# Patient Record
Sex: Female | Born: 1970 | Race: Black or African American | Hispanic: No | Marital: Single | Smoking: Never smoker
Health system: Southern US, Community
[De-identification: ages and names within clinical notes are randomized; demographics above are authoritative.]

## PROBLEM LIST (undated history)

## (undated) DIAGNOSIS — E785 Hyperlipidemia, unspecified: Secondary | ICD-10-CM

## (undated) DIAGNOSIS — H8109 Meniere's disease, unspecified ear: Secondary | ICD-10-CM

## (undated) DIAGNOSIS — B019 Varicella without complication: Secondary | ICD-10-CM

## (undated) DIAGNOSIS — K921 Melena: Secondary | ICD-10-CM

## (undated) DIAGNOSIS — A6009 Herpesviral infection of other urogenital tract: Secondary | ICD-10-CM

## (undated) HISTORY — DX: Meniere's disease, unspecified ear: H81.09

## (undated) HISTORY — DX: Varicella without complication: B01.9

## (undated) HISTORY — PX: EYE SURGERY: SHX253

## (undated) HISTORY — DX: Herpesviral infection of other urogenital tract: A60.09

## (undated) HISTORY — DX: Melena: K92.1

## (undated) HISTORY — PX: TUBAL LIGATION: SHX77

## (undated) HISTORY — DX: Hyperlipidemia, unspecified: E78.5

---

## 1997-11-26 ENCOUNTER — Inpatient Hospital Stay (HOSPITAL_COMMUNITY): Admission: AD | Admit: 1997-11-26 | Discharge: 1997-11-29 | Payer: Self-pay | Admitting: Obstetrics and Gynecology

## 2000-03-19 ENCOUNTER — Other Ambulatory Visit: Admission: RE | Admit: 2000-03-19 | Discharge: 2000-03-19 | Payer: Self-pay | Admitting: Obstetrics and Gynecology

## 2000-09-04 ENCOUNTER — Ambulatory Visit (HOSPITAL_COMMUNITY): Admission: RE | Admit: 2000-09-04 | Discharge: 2000-09-04 | Payer: Self-pay | Admitting: Internal Medicine

## 2000-09-04 ENCOUNTER — Encounter: Payer: Self-pay | Admitting: Internal Medicine

## 2001-03-22 ENCOUNTER — Other Ambulatory Visit: Admission: RE | Admit: 2001-03-22 | Discharge: 2001-03-22 | Payer: Self-pay | Admitting: Obstetrics and Gynecology

## 2003-08-22 ENCOUNTER — Other Ambulatory Visit: Admission: RE | Admit: 2003-08-22 | Discharge: 2003-08-22 | Payer: Self-pay | Admitting: Obstetrics and Gynecology

## 2007-03-10 ENCOUNTER — Ambulatory Visit (HOSPITAL_COMMUNITY): Admission: RE | Admit: 2007-03-10 | Discharge: 2007-03-10 | Payer: Self-pay | Admitting: Obstetrics and Gynecology

## 2007-11-06 IMAGING — US US-BREAST([ID])
1 series · 14 of 16 positions shown · non-contrast
Comparison: NONE

CLINICAL DATA: Follow-up mammogram.   Known cysts in the right 
breast.   New nodule anterior left breast on recent mammogram from 
05-14-06.  

LEFT BREAST ULTRASOUND

[Series 1: us breast · 0.07mm/px · 14 of 16 slices shown]
[im 1/16]
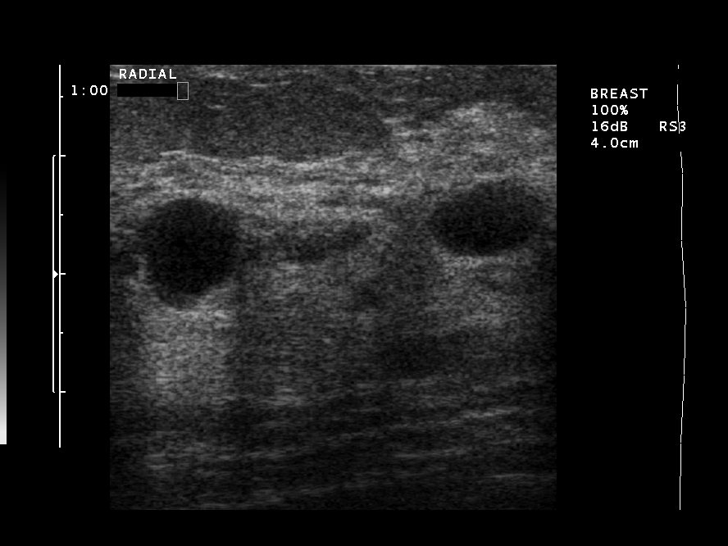
[im 2/16]
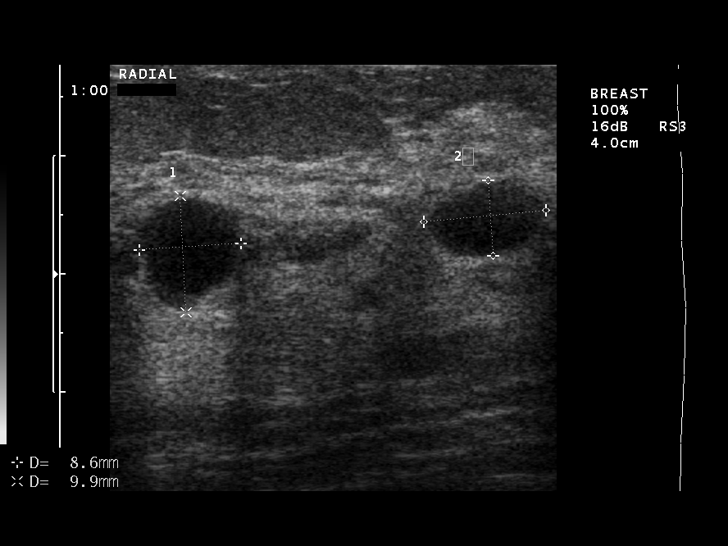
[im 3/16]
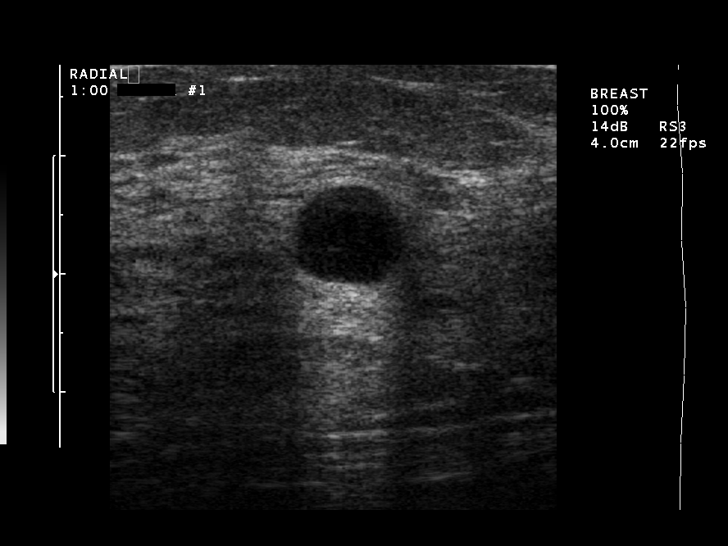
[im 5/16]
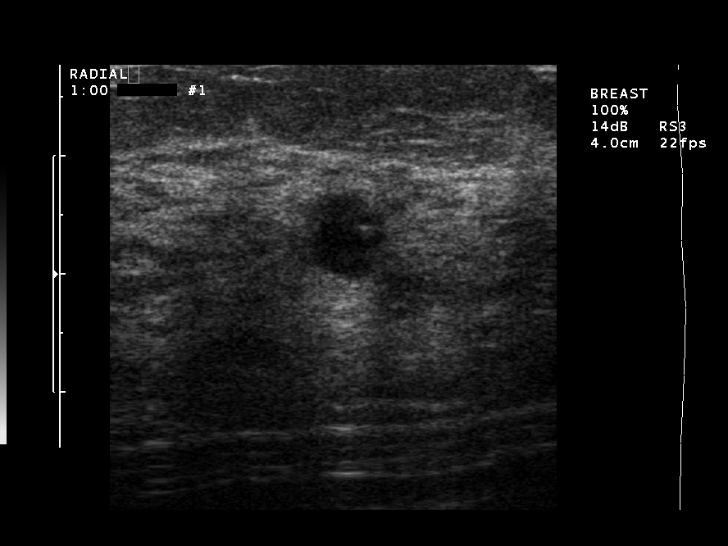
[im 6/16]
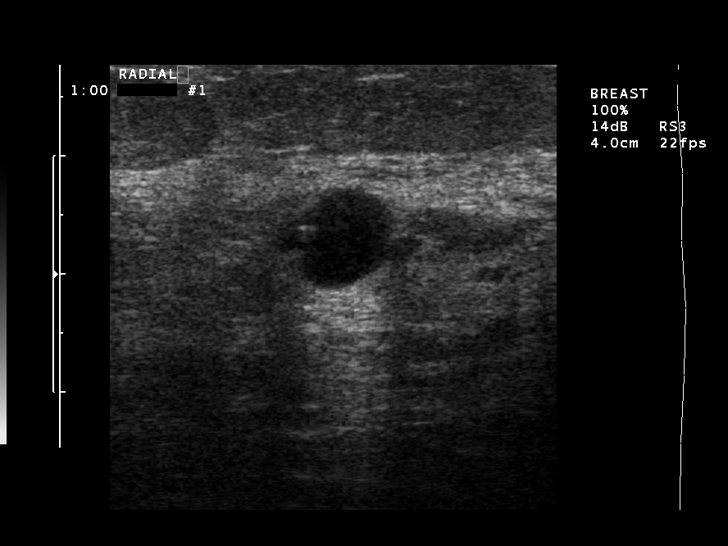
[im 7/16]
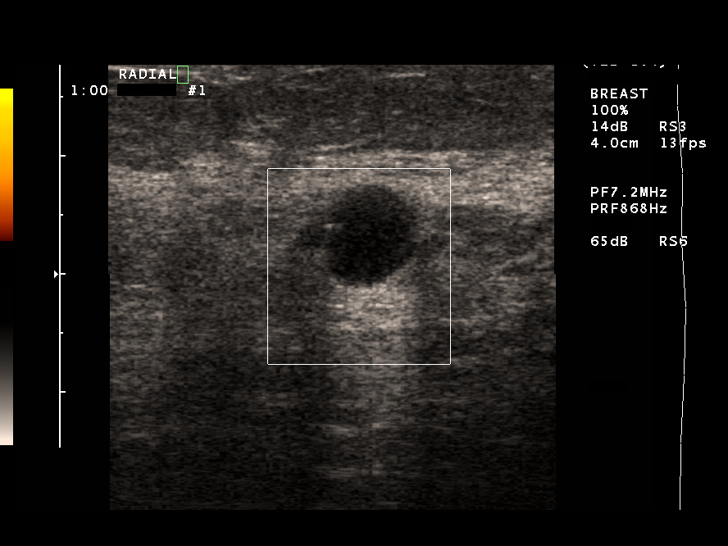
[im 8/16]
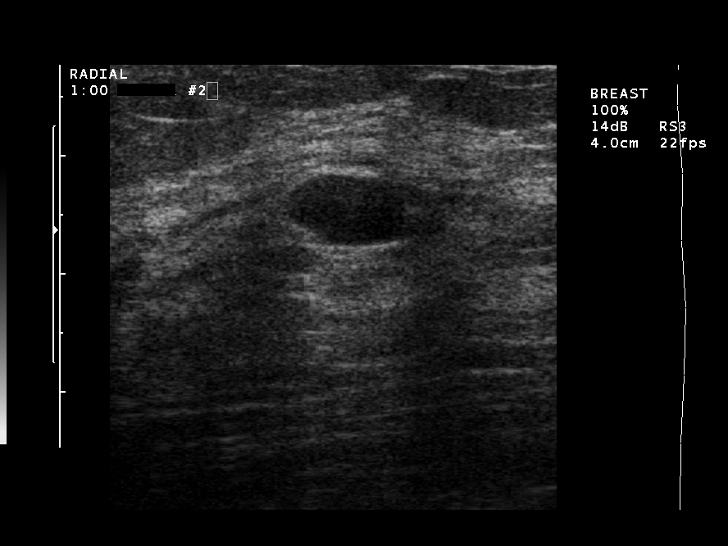
[im 9/16]
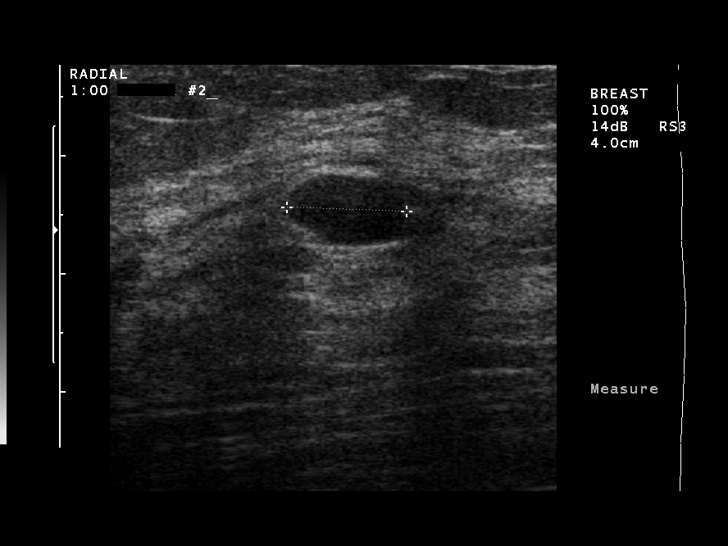
[im 10/16]
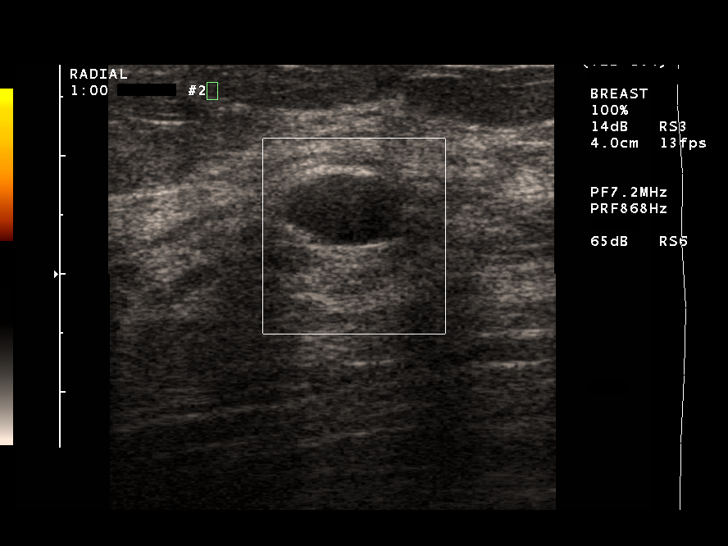
[im 11/16]
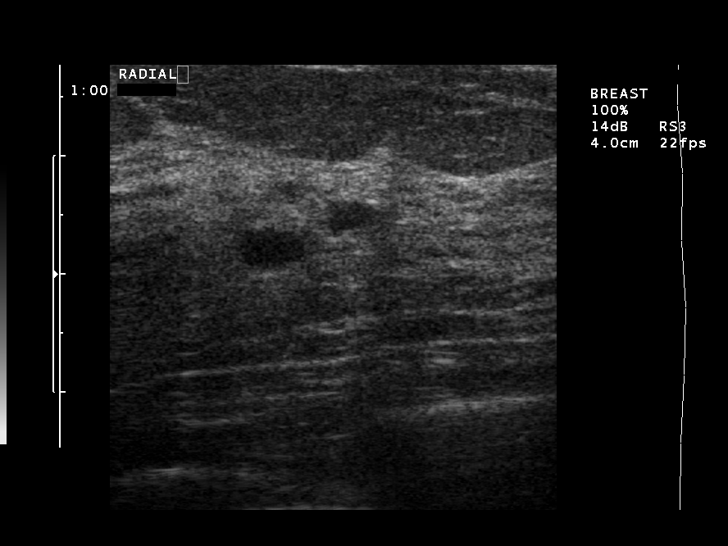
[im 13/16]
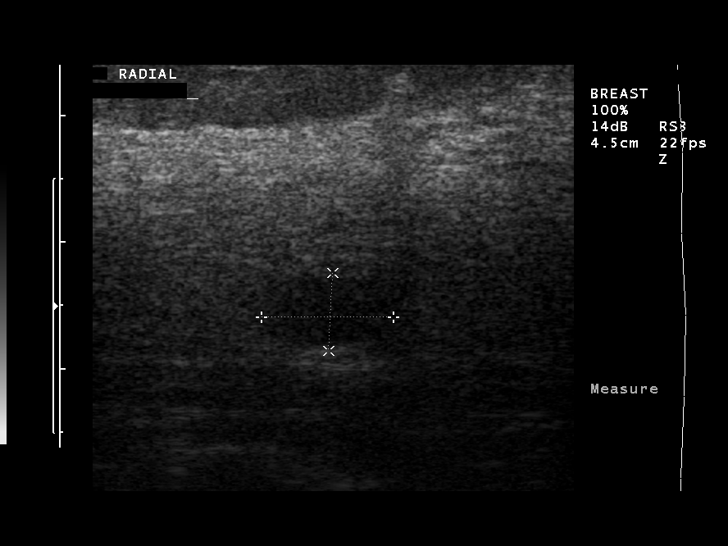
[im 14/16]
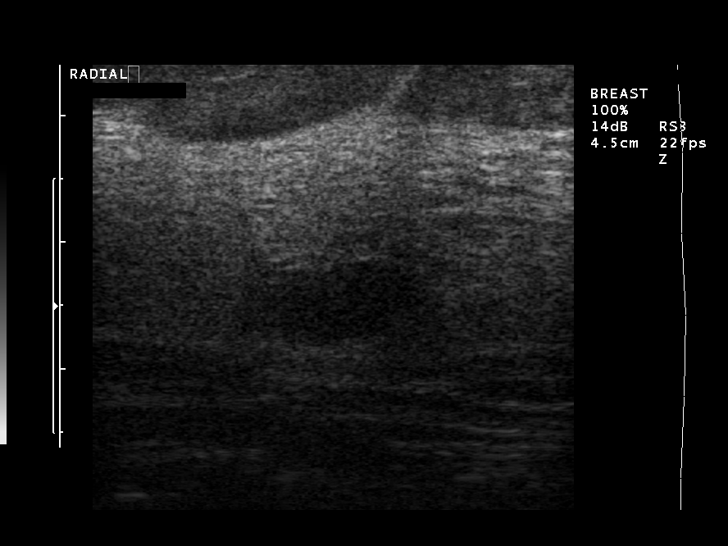
[im 15/16]
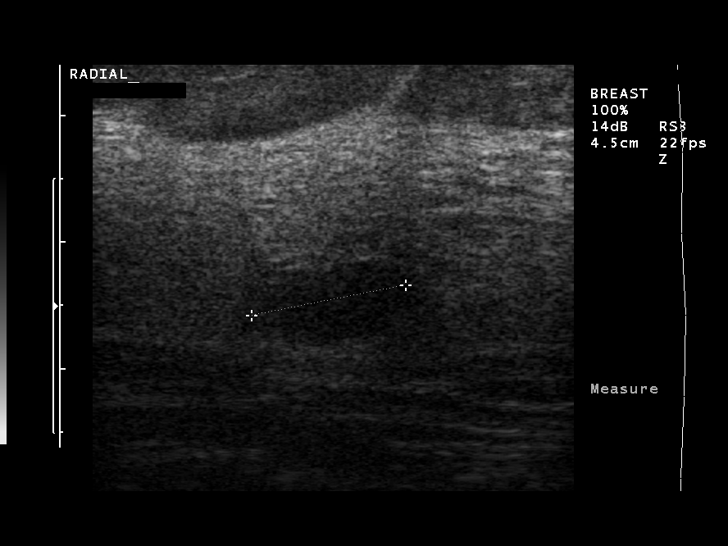
[im 16/16]
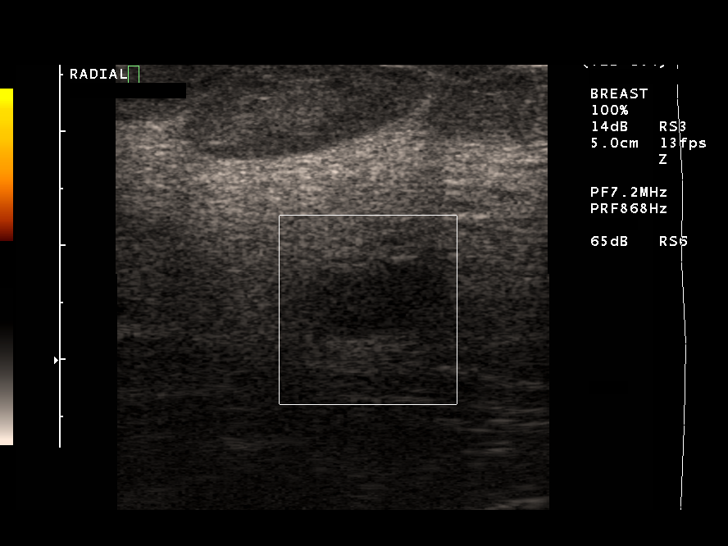

[14 of 16 positions shown; findings below may reference images not displayed]

FINDINGS: Ultrasound shows two simple benign cysts at [DATE] 
measuring 8.6 x 9.9 mm and 10.4 x 6.4 mm respectively with a third 
cyst more deep in position at [DATE] measuring 10.4 x 6.1 mm.
IMPRESSION: Three benign cysts left breast.  Annual screening 
mammography recommended. BI-RADS 2- Benign Jn Bernard Delpe, M.D. 
Date: 06/03/2006 DL  GIORGI

## 2009-11-29 ENCOUNTER — Telehealth (INDEPENDENT_AMBULATORY_CARE_PROVIDER_SITE_OTHER): Payer: Self-pay | Admitting: *Deleted

## 2010-05-22 NOTE — Progress Notes (Signed)
Summary: triage  Phone Note Call from Patient Call back at [phone]   Caller: Schioban, scheduler Reason for Call: Talk to Nurse Summary of Call: pt is sch'ed for next available (9/12), but Dr. Kathrynn Running would like pt seen sooner Initial call taken by: Vallarie Mare,  November 29, 2009 3:33 PM  Follow-up for Phone Call        Pt. will see Willette Cluster NP on 12-02-09 at 3pm. Schioban will advise pt. of appt/med.list/co-pay and she will fax records to [facility].  Follow-up by: Laureen Ochs LPN,  November 29, 2009 3:55 PM

## 2010-09-02 NOTE — Op Note (Signed)
NAME:  Kristi Mayo, Kristi Mayo              ACCOUNT NO.:  [id number]   MEDICAL RECORD NO.:  [id number]          PATIENT TYPE:  AMB   LOCATION:  [facility]                           FACILITY:  [facility]   PHYSICIAN:  Lenoard Aden, M.D.DATE OF BIRTH:  February 10, 1971   DATE OF PROCEDURE:  03/10/2007  DATE OF DISCHARGE:                               OPERATIVE REPORT   PREOPERATIVE DIAGNOSIS:  Desire for elective sterilization.   POSTOPERATIVE DIAGNOSIS:  Desire for elective sterilization.   PROCEDURE:  Laparoscopic tubal ligation.   SURGEON:  Lenoard Aden, M.D.   ANESTHESIA:  General.   ESTIMATED BLOOD LOSS:  Less than 50 mL.   COMPLICATIONS:  None.   DRAINS:  None.   COUNTS:  Correct.   Patient to recovery in good condition.   BRIEF OP NOTE:  Being apprised of risks of anesthesia, infection,  bleeding, injury to abdominal organs,  need for repair, delayed versus  complications to include bowel and bladder injury, failure risk of tubal  ligation 5 to 10 per thousand, the patient brought to the operating she  was administered general anesthetic without complications, prepped and  draped in usual sterile fashion, catheterized until bladder was empty.  Exam under anesthesia was a small anteflexed uterus.  No adnexal masses.  Hulka tenaculum placed per vagina.  Infraumbilical incision made with a  scalpel.  Veress needle placed, opening pressure -1 noted, 3 liters CO2  insufflated without difficulty.  Trocar placement atraumatically.  Visualization reveals atraumatic trocar placement, normal liver  gallbladder area, normal appendiceal area.  At this time the uterus  appears to be normal size, shape and contour.  No adnexal masses were  noted.  Bilateral normal tubes and ovaries.  No posterior or anterior  cul-de-sac lesions or growths are noted.  At this time the right tube  was traced out to the fimbriated end, ampullary isthmic portion of the  tube is grasped and coagulated using bipolar  cautery in three contiguous  areas of ampullary isthmic section of the tube.  Tube is divided, tubal  lumens are noted.  Same procedure that is done on the right tube is done  on the left tube.  Bilateral normal ovaries were noted.  Good hemostasis  noted.  CO2 was released.  Bowels inspected.  Ureters were peristalsing  normal bilaterally.  At this time procedure is terminated.  CO2 was  released.  All instruments removed under direct visualization.  Incision  closed using Vicryl and Dermabond.  The patient tolerated the procedure  well, went to recovery in good condition.      Lenoard Aden, M.D.  Electronically Signed     RJT/MEDQ  D:  03/10/2007  T:  03/11/2007  Job:  [id number]

## 2011-01-27 LAB — CBC
HCT: 37.5
Hemoglobin: 13
MCHC: 34.7
MCV: 92.3
Platelets: 352
RBC: 4.06
RDW: 12.7
WBC: 7.8

## 2011-01-27 LAB — HCG, SERUM, QUALITATIVE: Preg, Serum: NEGATIVE

## 2012-03-04 ENCOUNTER — Ambulatory Visit: Payer: Managed Care, Other (non HMO)

## 2013-07-14 ENCOUNTER — Encounter: Payer: Self-pay | Admitting: Physician Assistant

## 2013-07-14 ENCOUNTER — Other Ambulatory Visit (INDEPENDENT_AMBULATORY_CARE_PROVIDER_SITE_OTHER): Payer: Managed Care, Other (non HMO)

## 2013-07-14 ENCOUNTER — Ambulatory Visit (INDEPENDENT_AMBULATORY_CARE_PROVIDER_SITE_OTHER): Payer: Managed Care, Other (non HMO) | Admitting: Physician Assistant

## 2013-07-14 VITALS — BP 130/80 | Temp 98.3°F | Ht 65.0 in | Wt 177.1 lb

## 2013-07-14 DIAGNOSIS — A6000 Herpesviral infection of urogenital system, unspecified: Secondary | ICD-10-CM

## 2013-07-14 DIAGNOSIS — Z0000 Encounter for general adult medical examination without abnormal findings: Secondary | ICD-10-CM

## 2013-07-14 DIAGNOSIS — H8109 Meniere's disease, unspecified ear: Secondary | ICD-10-CM

## 2013-07-14 DIAGNOSIS — E785 Hyperlipidemia, unspecified: Secondary | ICD-10-CM

## 2013-07-14 LAB — URINALYSIS, ROUTINE W REFLEX MICROSCOPIC
Bilirubin Urine: NEGATIVE
Hgb urine dipstick: NEGATIVE
Ketones, ur: 15 — AB
Leukocytes, UA: NEGATIVE
Nitrite: NEGATIVE
SPECIFIC GRAVITY, URINE: 1.01 (ref 1.000–1.030)
TOTAL PROTEIN, URINE-UPE24: NEGATIVE
URINE GLUCOSE: NEGATIVE
UROBILINOGEN UA: 1 (ref 0.0–1.0)
pH: 7 (ref 5.0–8.0)

## 2013-07-14 LAB — CBC WITH DIFFERENTIAL/PLATELET
Basophils Absolute: 0 10*3/uL (ref 0.0–0.1)
Basophils Relative: 0.4 % (ref 0.0–3.0)
EOS PCT: 0.7 % (ref 0.0–5.0)
Eosinophils Absolute: 0.1 10*3/uL (ref 0.0–0.7)
HCT: 38.3 % (ref 36.0–46.0)
Hemoglobin: 12.8 g/dL (ref 12.0–15.0)
LYMPHS PCT: 24.9 % (ref 12.0–46.0)
Lymphs Abs: 2.2 10*3/uL (ref 0.7–4.0)
MCHC: 33.5 g/dL (ref 30.0–36.0)
MCV: 90.7 fl (ref 78.0–100.0)
Monocytes Absolute: 0.3 10*3/uL (ref 0.1–1.0)
Monocytes Relative: 3.1 % (ref 3.0–12.0)
NEUTROS PCT: 70.9 % (ref 43.0–77.0)
Neutro Abs: 6.2 10*3/uL (ref 1.4–7.7)
PLATELETS: 323 10*3/uL (ref 150.0–400.0)
RBC: 4.23 Mil/uL (ref 3.87–5.11)
RDW: 12.7 % (ref 11.5–14.6)
WBC: 8.7 10*3/uL (ref 4.5–10.5)

## 2013-07-14 LAB — LIPID PANEL
CHOLESTEROL: 240 mg/dL — AB (ref 0–200)
HDL: 38.6 mg/dL — ABNORMAL LOW (ref >39.00)
LDL Cholesterol: 182 mg/dL — ABNORMAL HIGH (ref 0–99)
Total CHOL/HDL Ratio: 6
Triglycerides: 96 mg/dL (ref 0.0–149.0)
VLDL: 19.2 mg/dL (ref 0.0–40.0)

## 2013-07-14 LAB — BASIC METABOLIC PANEL
BUN: 8 mg/dL (ref 6–23)
CALCIUM: 9.2 mg/dL (ref 8.4–10.5)
CHLORIDE: 105 meq/L (ref 96–112)
CO2: 24 mEq/L (ref 19–32)
CREATININE: 0.6 mg/dL (ref 0.4–1.2)
GFR: 143.57 mL/min (ref >60.00)
Glucose, Bld: 84 mg/dL (ref 70–99)
Potassium: 3.7 mEq/L (ref 3.5–5.1)
Sodium: 137 mEq/L (ref 135–145)

## 2013-07-14 LAB — TSH: TSH: 1.05 u[IU]/mL (ref 0.35–5.50)

## 2013-07-14 LAB — HEPATIC FUNCTION PANEL
ALBUMIN: 4 g/dL (ref 3.5–5.2)
ALT: 12 U/L (ref 0–35)
AST: 13 U/L (ref 0–37)
Alkaline Phosphatase: 87 U/L (ref 39–117)
BILIRUBIN DIRECT: 0.1 mg/dL (ref 0.0–0.3)
Total Bilirubin: 0.7 mg/dL (ref 0.3–1.2)
Total Protein: 7.6 g/dL (ref 6.0–8.3)

## 2013-07-14 NOTE — Progress Notes (Signed)
Pre visit review using our clinic review tool, if applicable. No additional management support is needed unless otherwise documented below in the visit note. 

## 2013-07-14 NOTE — Progress Notes (Signed)
Patient ID: Kristi HaversKenyetta N Pinney is a 43 y.o. female DOB: 2070/10/05 MRN: [id number]     HPI:  Patient is a 43 year old female who presents to the office to establish care. Reports history of Meniere's disease treated with Chlorthalid and Pot chloride. Follow with ENT for continued yearly treatment and evaluation. Also history of Genital herpes treated with Valtrax.250 mg as needed.  Denies chest pain/palpitations, SOB, cough, N/V/F/C, change in bowel/bladder habits, blood in urine or stool, light headed, dizzy or weakness.   Influenza: no Tetanus: less than 10 years ago PAP: 10/14 LMP: 06/23/13 Mammogram: 06/2013 Eye Dr. Edd ArbourYearly, ophlamology Dentist regular visit Colonoscopy: 8 years ago for hemorrhoid evaluation   ROS: As stated in HPI. All other systems negative  Past Medical History  Diagnosis Date  . Meniere disease   . Blood in stool   . Chicken pox   . Hyperlipidemia   . Genital herpes in women    Family History  Problem Relation Age of Onset  . Hyperlipidemia Mother   . Hypertension Mother   . Prostate cancer Father   . Hyperlipidemia Father   . Hypertension Father   . Hyperlipidemia Brother   . Hypertension Brother   . Hypertension Maternal Grandmother   . Breast cancer Maternal Grandmother    History   Social History  . Marital Status: Single    Spouse Name: N/A    Number of Children: N/A  . Years of Education: N/A   Social History Main Topics  . Smoking status: Never Smoker   . Smokeless tobacco: None  . Alcohol Use: No  . Drug Use: No  . Sexual Activity: Yes    Partners: Female, Female   Other Topics Concern  . None   Social History Narrative  . None   Past Surgical History  Procedure Laterality Date  . Cesarean section    . Eye surgery    . Tubal ligation     No current outpatient prescriptions on file prior to visit.   No current facility-administered medications on file prior to visit.   Allergies  Allergen Reactions  . Penicillins  Nausea And Vomiting    PE:  Filed Vitals:   07/14/13 1104  BP: 130/80  Temp: 98.3 F (36.8 C)    CONSTITUTIONAL: Well developed, well nourished, pleasant, appears stated age, in NAD HEENT: normocephalic, atraumatic, bilateral ext/int canals normal. Bilateral TM's without injections, bulging, erythema. Nose normal, uvula midline, oropharynx clear and moist. EYES: PERRLA, bilateral EOM and conjunctiva normal NECK: FROM, supple, trachea midline, without thyromegaly or mass CARDIO: RRR, normal S1 and S2, distal pulses intact. PULM/CHEST CTA bilateral, no wheezes, rales or rhonchi. Non tender. ABD: appearance normal, soft, nontender. Normal bowel sounds x 4 quadrants, no HSM GU: deferred.  MUSC: FROM U/LE bilateral, FROM  Of thoracic and Lumbar spine, no midline tenderness LYMPH: no cervical, supraclavicular adenopathy NEURO: alert and oriented x 3, no cranial nerve deficit, motor strength and coordination NL. DTR's intact.  Negative romberg. Gait normal. SKIN: warm, dry, no rash or lesions noted. PSYCH: Mood and affect normal, speech normal.   Lab Results  Component Value Date   WBC 8.7 07/14/2013   HGB 12.8 07/14/2013   HCT 38.3 07/14/2013   PLT 323.0 07/14/2013   GLUCOSE 84 07/14/2013   CHOL 240* 07/14/2013   TRIG 96.0 07/14/2013   HDL 38.60* 07/14/2013   LDLCALC 182* 07/14/2013   ALT 12 07/14/2013   AST 13 07/14/2013   NA 137 07/14/2013  K 3.7 07/14/2013   CL 105 07/14/2013   CREATININE 0.6 07/14/2013   BUN 8 07/14/2013   CO2 24 07/14/2013   TSH 1.05 07/14/2013      ASSESSMENT and PLAN   CPX/v70.0 - Patient has been counseled on age-appropriate routine health concerns for screening and prevention. These are reviewed and up-to-date. Immunizations are up-to-date or declined. Labs ordered and will be reviewed.  Genital Herpes Continue with current medication as prescribed  Meniere's Continue with current medications Keep scheduled follow up appointments with  ENT  Hyperlipidemia Tests show increased cholesterol. No medication treatment recommended but patient should work on low fat, heart healthy diet and participate in regular aerobic exercise program to control same.

## 2013-07-14 NOTE — Patient Instructions (Signed)
It was great to meet you today Kristi Mayo!   Labs have been ordered for you, when you report to lab please be fasting.    Health Maintenance, Female A healthy lifestyle and preventative care can promote health and wellness.  Maintain regular health, dental, and eye exams.  Eat a healthy diet. Foods like vegetables, fruits, whole grains, low-fat dairy products, and lean protein foods contain the nutrients you need without too many calories. Decrease your intake of foods high in solid fats, added sugars, and salt. Get information about a proper diet from your caregiver, if necessary.  Regular physical exercise is one of the most important things you can do for your health. Most adults should get at least 150 minutes of moderate-intensity exercise (any activity that increases your heart rate and causes you to sweat) each week. In addition, most adults need muscle-strengthening exercises on 2 or more days a week.   Maintain a healthy weight. The body mass index (BMI) is a screening tool to identify possible weight problems. It provides an estimate of body fat based on height and weight. Your caregiver can help determine your BMI, and can help you achieve or maintain a healthy weight. For adults 20 years and older:  A BMI below 18.5 is considered underweight.  A BMI of 18.5 to 24.9 is normal.  A BMI of 25 to 29.9 is considered overweight.  A BMI of 30 and above is considered obese.  Maintain normal blood lipids and cholesterol by exercising and minimizing your intake of saturated fat. Eat a balanced diet with plenty of fruits and vegetables. Blood tests for lipids and cholesterol should begin at age 48 and be repeated every 5 years. If your lipid or cholesterol levels are high, you are over 50, or you are a high risk for heart disease, you may need your cholesterol levels checked more frequently.Ongoing high lipid and cholesterol levels should be treated with medicines if diet and exercise are  not effective.  If you smoke, find out from your caregiver how to quit. If you do not use tobacco, do not start.  Lung cancer screening is recommended for adults aged 51 80 years who are at high risk for developing lung cancer because of a history of smoking. Yearly low-dose computed tomography (CT) is recommended for people who have at least a 30-pack-year history of smoking and are a current smoker or have quit within the past 15 years. A pack year of smoking is smoking an average of 1 pack of cigarettes a day for 1 year (for example: 1 pack a day for 30 years or 2 packs a day for 15 years). Yearly screening should continue until the smoker has stopped smoking for at least 15 years. Yearly screening should also be stopped for people who develop a health problem that would prevent them from having lung cancer treatment.  If you are pregnant, do not drink alcohol. If you are breastfeeding, be very cautious about drinking alcohol. If you are not pregnant and choose to drink alcohol, do not exceed 1 drink per day. One drink is considered to be 12 ounces (355 mL) of beer, 5 ounces (148 mL) of wine, or 1.5 ounces (44 mL) of liquor.  Avoid use of street drugs. Do not share needles with anyone. Ask for help if you need support or instructions about stopping the use of drugs.  High blood pressure causes heart disease and increases the risk of stroke. Blood pressure should be checked at least  every 1 to 2 years. Ongoing high blood pressure should be treated with medicines, if weight loss and exercise are not effective.  If you are 35 to 43 years old, ask your caregiver if you should take aspirin to prevent strokes.  Diabetes screening involves taking a blood sample to check your fasting blood sugar level. This should be done once every 3 years, after age 34, if you are within normal weight and without risk factors for diabetes. Testing should be considered at a younger age or be carried out more frequently if  you are overweight and have at least 1 risk factor for diabetes.  Breast cancer screening is essential preventative care for women. You should practice "breast self-awareness." This means understanding the normal appearance and feel of your breasts and may include breast self-examination. Any changes detected, no matter how small, should be reported to a caregiver. Women in their 61s and 30s should have a clinical breast exam (CBE) by a caregiver as part of a regular health exam every 1 to 3 years. After age 40, women should have a CBE every year. Starting at age 62, women should consider having a mammogram (breast X-ray) every year. Women who have a family history of breast cancer should talk to their caregiver about genetic screening. Women at a high risk of breast cancer should talk to their caregiver about having an MRI and a mammogram every year.  Breast cancer gene (BRCA)-related cancer risk assessment is recommended for women who have family members with BRCA-related cancers. BRCA-related cancers include breast, ovarian, tubal, and peritoneal cancers. Having family members with these cancers may be associated with an increased risk for harmful changes (mutations) in the breast cancer genes BRCA1 and BRCA2. Results of the assessment will determine the need for genetic counseling and BRCA1 and BRCA2 testing.  The Pap test is a screening test for cervical cancer. Women should have a Pap test starting at age 80. Between ages 28 and 21, Pap tests should be repeated every 2 years. Beginning at age 46, you should have a Pap test every 3 years as long as the past 3 Pap tests have been normal. If you had a hysterectomy for a problem that was not cancer or a condition that could lead to cancer, then you no longer need Pap tests. If you are between ages 34 and 77, and you have had normal Pap tests going back 10 years, you no longer need Pap tests. If you have had past treatment for cervical cancer or a condition  that could lead to cancer, you need Pap tests and screening for cancer for at least 20 years after your treatment. If Pap tests have been discontinued, risk factors (such as a new sexual partner) need to be reassessed to determine if screening should be resumed. Some women have medical problems that increase the chance of getting cervical cancer. In these cases, your caregiver may recommend more frequent screening and Pap tests.  The human papillomavirus (HPV) test is an additional test that may be used for cervical cancer screening. The HPV test looks for the virus that can cause the cell changes on the cervix. The cells collected during the Pap test can be tested for HPV. The HPV test could be used to screen women aged 39 years and older, and should be used in women of any age who have unclear Pap test results. After the age of 61, women should have HPV testing at the same frequency as a Pap test.  Colorectal cancer can be detected and often prevented. Most routine colorectal cancer screening begins at the age of 63 and continues through age 58. However, your caregiver may recommend screening at an earlier age if you have risk factors for colon cancer. On a yearly basis, your caregiver may provide home test kits to check for hidden blood in the stool. Use of a small camera at the end of a tube, to directly examine the colon (sigmoidoscopy or colonoscopy), can detect the earliest forms of colorectal cancer. Talk to your caregiver about this at age 79, when routine screening begins. Direct examination of the colon should be repeated every 5 to 10 years through age 6, unless early forms of pre-cancerous polyps or small growths are found.  Hepatitis C blood testing is recommended for all people born from [birth year] through 1965 and any individual with known risks for hepatitis C.  Practice safe sex. Use condoms and avoid high-risk sexual practices to reduce the spread of sexually transmitted infections (STIs).  Sexually active women aged 67 and younger should be checked for Chlamydia, which is a common sexually transmitted infection. Older women with new or multiple partners should also be tested for Chlamydia. Testing for other STIs is recommended if you are sexually active and at increased risk.  Osteoporosis is a disease in which the bones lose minerals and strength with aging. This can result in serious bone fractures. The risk of osteoporosis can be identified using a bone density scan. Women ages 86 and over and women at risk for fractures or osteoporosis should discuss screening with their caregivers. Ask your caregiver whether you should be taking a calcium supplement or vitamin D to reduce the rate of osteoporosis.  Menopause can be associated with physical symptoms and risks. Hormone replacement therapy is available to decrease symptoms and risks. You should talk to your caregiver about whether hormone replacement therapy is right for you.  Use sunscreen. Apply sunscreen liberally and repeatedly throughout the day. You should seek shade when your shadow is shorter than you. Protect yourself by wearing long sleeves, pants, a wide-brimmed hat, and sunglasses year round, whenever you are outdoors.  Notify your caregiver of new moles or changes in moles, especially if there is a change in shape or color. Also notify your caregiver if a mole is larger than the size of a pencil eraser.  Stay current with your immunizations. Document Released: 10/20/2010 Document Revised: 08/01/2012 Document Reviewed: 10/20/2010 [facility] Patient Information 2014 [place].

## 2014-02-19 ENCOUNTER — Encounter: Payer: Self-pay | Admitting: Physician Assistant

## 2014-10-29 ENCOUNTER — Encounter: Payer: Self-pay | Admitting: Family

## 2014-10-29 ENCOUNTER — Other Ambulatory Visit (INDEPENDENT_AMBULATORY_CARE_PROVIDER_SITE_OTHER): Payer: Managed Care, Other (non HMO)

## 2014-10-29 ENCOUNTER — Ambulatory Visit (INDEPENDENT_AMBULATORY_CARE_PROVIDER_SITE_OTHER): Payer: Managed Care, Other (non HMO) | Admitting: Family

## 2014-10-29 VITALS — BP 126/92 | HR 81 | Temp 98.0°F | Resp 18 | Ht 66.0 in | Wt 169.8 lb

## 2014-10-29 DIAGNOSIS — Z0000 Encounter for general adult medical examination without abnormal findings: Secondary | ICD-10-CM

## 2014-10-29 DIAGNOSIS — R300 Dysuria: Secondary | ICD-10-CM | POA: Diagnosis not present

## 2014-10-29 DIAGNOSIS — Z23 Encounter for immunization: Secondary | ICD-10-CM | POA: Diagnosis not present

## 2014-10-29 LAB — BASIC METABOLIC PANEL
BUN: 6 mg/dL (ref 6–23)
CO2: 28 mEq/L (ref 19–32)
Calcium: 9.4 mg/dL (ref 8.4–10.5)
Chloride: 105 mEq/L (ref 96–112)
Creatinine, Ser: 0.65 mg/dL (ref 0.40–1.20)
GFR: 127.6 mL/min (ref >60.00)
GLUCOSE: 92 mg/dL (ref 70–99)
Potassium: 3.9 mEq/L (ref 3.5–5.1)
SODIUM: 139 meq/L (ref 135–145)

## 2014-10-29 LAB — POCT URINALYSIS DIPSTICK
Bilirubin, UA: NEGATIVE
Glucose, UA: NEGATIVE
KETONES UA: NEGATIVE
NITRITE UA: NEGATIVE
PH UA: 7
RBC UA: NEGATIVE
SPEC GRAV UA: 1.01
Urobilinogen, UA: NEGATIVE

## 2014-10-29 LAB — LIPID PANEL
CHOL/HDL RATIO: 6
Cholesterol: 240 mg/dL — ABNORMAL HIGH (ref 0–200)
HDL: 40.2 mg/dL (ref >39.00)
LDL CALC: 171 mg/dL — AB (ref 0–99)
NonHDL: 199.8
TRIGLYCERIDES: 142 mg/dL (ref 0.0–149.0)
VLDL: 28.4 mg/dL (ref 0.0–40.0)

## 2014-10-29 LAB — CBC
HCT: 38.4 % (ref 36.0–46.0)
Hemoglobin: 12.8 g/dL (ref 12.0–15.0)
MCHC: 33.5 g/dL (ref 30.0–36.0)
MCV: 90.4 fl (ref 78.0–100.0)
Platelets: 335 10*3/uL (ref 150.0–400.0)
RBC: 4.25 Mil/uL (ref 3.87–5.11)
RDW: 12.8 % (ref 11.5–15.5)
WBC: 8.9 10*3/uL (ref 4.0–10.5)

## 2014-10-29 LAB — TSH: TSH: 1.18 u[IU]/mL (ref 0.35–4.50)

## 2014-10-29 MED ORDER — CIPROFLOXACIN HCL 250 MG PO TABS: 250.0000 mg | ORAL_TABLET | Freq: Two times a day (BID) | ORAL | Status: DC

## 2014-10-29 MED ORDER — FLUCONAZOLE 150 MG PO TABS: 150.0000 mg | ORAL_TABLET | Freq: Once | ORAL | Status: DC

## 2014-10-29 NOTE — Progress Notes (Signed)
Pre visit review using our clinic review tool, if applicable. No additional management support is needed unless otherwise documented below in the visit note. 

## 2014-10-29 NOTE — Patient Instructions (Addendum)
Thank you for choosing [facility].  Summary/Instructions:  Your prescription(s) have been submitted to your pharmacy or been printed and provided for you. Please take as directed and contact our office if you believe you are having problem(s) with the medication(s) or have any questions.  Please stop by the lab on the basement level of the building for your blood work. Your results will be released to MyChart (or called to you) after review, usually within 72 hours after test completion. If any changes need to be made, you will be notified at that same time.  If your symptoms worsen or fail to improve, please contact our office for further instruction, or in case of emergency go directly to the emergency room at the closest medical facility.   Health Maintenance Adopting a healthy lifestyle and getting preventive care can go a long way to promote health and wellness. Talk with your health care provider about what schedule of regular examinations is right for you. This is a good chance for you to check in with your provider about disease prevention and staying healthy. In between checkups, there are plenty of things you can do on your own. Experts have done a lot of research about which lifestyle changes and preventive measures are most likely to keep you healthy. Ask your health care provider for more information. WEIGHT AND DIET  Eat a healthy diet  Be sure to include plenty of vegetables, fruits, low-fat dairy products, and lean protein.  Do not eat a lot of foods high in solid fats, added sugars, or salt.  Get regular exercise. This is one of the most important things you can do for your health.  Most adults should exercise for at least 150 minutes each week. The exercise should increase your heart rate and make you sweat (moderate-intensity exercise).  Most adults should also do strengthening exercises at least twice a week. This is in addition to the moderate-intensity exercise.   Maintain a healthy weight  Body mass index (BMI) is a measurement that can be used to identify possible weight problems. It estimates body fat based on height and weight. Your health care provider can help determine your BMI and help you achieve or maintain a healthy weight.  For females 20 years of age and older:   A BMI below 18.5 is considered underweight.  A BMI of 18.5 to 24.9 is normal.  A BMI of 25 to 29.9 is considered overweight.  A BMI of 30 and above is considered obese.  Watch levels of cholesterol and blood lipids  You should start having your blood tested for lipids and cholesterol at 44 years of age, then have this test every 5 years.  You may need to have your cholesterol levels checked more often if:  Your lipid or cholesterol levels are high.  You are older than 44 years of age.  You are at high risk for heart disease.  CANCER SCREENING   Lung Cancer  Lung cancer screening is recommended for adults 55-80 years old who are at high risk for lung cancer because of a history of smoking.  A yearly low-dose CT scan of the lungs is recommended for people who:  Currently smoke.  Have quit within the past 15 years.  Have at least a 30-pack-year history of smoking. A pack year is smoking an average of one pack of cigarettes a day for 1 year.  Yearly screening should continue until it has been 15 years since you quit.  Yearly   screening should stop if you develop a health problem that would prevent you from having lung cancer treatment.  Breast Cancer  Practice breast self-awareness. This means understanding how your breasts normally appear and feel.  It also means doing regular breast self-exams. Let your health care provider know about any changes, no matter how small.  If you are in your 20s or 30s, you should have a clinical breast exam (CBE) by a health care provider every 1-3 years as part of a regular health exam.  If you are 40 or older, have a  CBE every year. Also consider having a breast X-ray (mammogram) every year.  If you have a family history of breast cancer, talk to your health care provider about genetic screening.  If you are at high risk for breast cancer, talk to your health care provider about having an MRI and a mammogram every year.  Breast cancer gene (BRCA) assessment is recommended for women who have family members with BRCA-related cancers. BRCA-related cancers include:  Breast.  Ovarian.  Tubal.  Peritoneal cancers.  Results of the assessment will determine the need for genetic counseling and BRCA1 and BRCA2 testing. Cervical Cancer Routine pelvic examinations to screen for cervical cancer are no longer recommended for nonpregnant women who are considered low risk for cancer of the pelvic organs (ovaries, uterus, and vagina) and who do not have symptoms. A pelvic examination may be necessary if you have symptoms including those associated with pelvic infections. Ask your health care provider if a screening pelvic exam is right for you.   The Pap test is the screening test for cervical cancer for women who are considered at risk.  If you had a hysterectomy for a problem that was not cancer or a condition that could lead to cancer, then you no longer need Pap tests.  If you are older than 65 years, and you have had normal Pap tests for the past 10 years, you no longer need to have Pap tests.  If you have had past treatment for cervical cancer or a condition that could lead to cancer, you need Pap tests and screening for cancer for at least 20 years after your treatment.  If you no longer get a Pap test, assess your risk factors if they change (such as having a new sexual partner). This can affect whether you should start being screened again.  Some women have medical problems that increase their chance of getting cervical cancer. If this is the case for you, your health care provider may recommend more  frequent screening and Pap tests.  The human papillomavirus (HPV) test is another test that may be used for cervical cancer screening. The HPV test looks for the virus that can cause cell changes in the cervix. The cells collected during the Pap test can be tested for HPV.  The HPV test can be used to screen women 30 years of age and older. Getting tested for HPV can extend the interval between normal Pap tests from three to five years.  An HPV test also should be used to screen women of any age who have unclear Pap test results.  After 44 years of age, women should have HPV testing as often as Pap tests.  Colorectal Cancer  This type of cancer can be detected and often prevented.  Routine colorectal cancer screening usually begins at 44 years of age and continues through 44 years of age.  Your health care provider may recommend screening at an   earlier age if you have risk factors for colon cancer.  Your health care provider may also recommend using home test kits to check for hidden blood in the stool.  A small camera at the end of a tube can be used to examine your colon directly (sigmoidoscopy or colonoscopy). This is done to check for the earliest forms of colorectal cancer.  Routine screening usually begins at age 50.  Direct examination of the colon should be repeated every 5-10 years through 44 years of age. However, you may need to be screened more often if early forms of precancerous polyps or small growths are found. Skin Cancer  Check your skin from head to toe regularly.  Tell your health care provider about any new moles or changes in moles, especially if there is a change in a mole's shape or color.  Also tell your health care provider if you have a mole that is larger than the size of a pencil eraser.  Always use sunscreen. Apply sunscreen liberally and repeatedly throughout the day.  Protect yourself by wearing long sleeves, pants, a wide-brimmed hat, and sunglasses  whenever you are outside. HEART DISEASE, DIABETES, AND HIGH BLOOD PRESSURE   Have your blood pressure checked at least every 1-2 years. High blood pressure causes heart disease and increases the risk of stroke.  If you are between 55 years and 79 years old, ask your health care provider if you should take aspirin to prevent strokes.  Have regular diabetes screenings. This involves taking a blood sample to check your fasting blood sugar level.  If you are at a normal weight and have a low risk for diabetes, have this test once every three years after 45 years of age.  If you are overweight and have a high risk for diabetes, consider being tested at a younger age or more often. PREVENTING INFECTION  Hepatitis B  If you have a higher risk for hepatitis B, you should be screened for this virus. You are considered at high risk for hepatitis B if:  You were born in a country where hepatitis B is common. Ask your health care provider which countries are considered high risk.  Your parents were born in a high-risk country, and you have not been immunized against hepatitis B (hepatitis B vaccine).  You have HIV or AIDS.  You use needles to inject street drugs.  You live with someone who has hepatitis B.  You have had sex with someone who has hepatitis B.  You get hemodialysis treatment.  You take certain medicines for conditions, including cancer, organ transplantation, and autoimmune conditions. Hepatitis C  Blood testing is recommended for:  Everyone born from 1945 through 1965.  Anyone with known risk factors for hepatitis C. Sexually transmitted infections (STIs)  You should be screened for sexually transmitted infections (STIs) including gonorrhea and chlamydia if:  You are sexually active and are younger than 44 years of age.  You are older than 44 years of age and your health care provider tells you that you are at risk for this type of infection.  Your sexual activity  has changed since you were last screened and you are at an increased risk for chlamydia or gonorrhea. Ask your health care provider if you are at risk.  If you do not have HIV, but are at risk, it may be recommended that you take a prescription medicine daily to prevent HIV infection. This is called pre-exposure prophylaxis (PrEP). You are considered at risk   if:  You are sexually active and do not regularly use condoms or know the HIV status of your partner(s).  You take drugs by injection.  You are sexually active with a partner who has HIV. Talk with your health care provider about whether you are at high risk of being infected with HIV. If you choose to begin PrEP, you should first be tested for HIV. You should then be tested every 3 months for as long as you are taking PrEP.  PREGNANCY   If you are premenopausal and you may become pregnant, ask your health care provider about preconception counseling.  If you may become pregnant, take 400 to 800 micrograms (mcg) of folic acid every day.  If you want to prevent pregnancy, talk to your health care provider about birth control (contraception). OSTEOPOROSIS AND MENOPAUSE   Osteoporosis is a disease in which the bones lose minerals and strength with aging. This can result in serious bone fractures. Your risk for osteoporosis can be identified using a bone density scan.  If you are 65 years of age or older, or if you are at risk for osteoporosis and fractures, ask your health care provider if you should be screened.  Ask your health care provider whether you should take a calcium or vitamin D supplement to lower your risk for osteoporosis.  Menopause may have certain physical symptoms and risks.  Hormone replacement therapy may reduce some of these symptoms and risks. Talk to your health care provider about whether hormone replacement therapy is right for you.  HOME CARE INSTRUCTIONS   Schedule regular health, dental, and eye  exams.  Stay current with your immunizations.   Do not use any tobacco products including cigarettes, chewing tobacco, or electronic cigarettes.  If you are pregnant, do not drink alcohol.  If you are breastfeeding, limit how much and how often you drink alcohol.  Limit alcohol intake to no more than 1 drink per day for nonpregnant women. One drink equals 12 ounces of beer, 5 ounces of wine, or 1 ounces of hard liquor.  Do not use street drugs.  Do not share needles.  Ask your health care provider for help if you need support or information about quitting drugs.  Tell your health care provider if you often feel depressed.  Tell your health care provider if you have ever been abused or do not feel safe at home. Document Released: 10/20/2010 Document Revised: 08/21/2013 Document Reviewed: 03/08/2013 Exit[employer] Patient Information 2015 [employer]. This information is not intended to replace advice given to you by your health care provider. Make sure you discuss any questions you have with your health care provider.   

## 2014-10-29 NOTE — Progress Notes (Signed)
Subjective:    Patient ID: Kristi Mayo, female    DOB: 1971/03/26, 44 y.o.   MRN: [id number]  Chief Complaint  Patient presents with  . CPE    Fasting    HPI:  Kristi Mayo is a 44 y.o. female who presents today for an annual wellness visit.   1) Health Maintenance -   Diet - Averages about 2 meals per day consisting fruits, some vegetables, chicken, rare fish; 2-3 cups of caffeine per day  Exercise - Walks 4-5 miles 4-5x per week   2) Preventative Exams / Immunizations:  Dental -- Up to date  Vision -- Up to date   Health Maintenance  Topic Date Due  . HIV Screening  04/08/1986  . TETANUS/TDAP  04/08/1990  . INFLUENZA VACCINE  11/19/2014  . PAP SMEAR  02/14/2016  Tetanus shot   There is no immunization history on file for this patient.  Allergies  Allergen Reactions  . Penicillins Nausea And Vomiting     Outpatient Prescriptions Prior to Visit  Medication Sig Dispense Refill  . chlorthalidone (HYGROTON) 25 MG tablet     . potassium chloride (MICRO-K) 10 MEQ CR capsule     . valACYclovir (VALTREX) 1000 MG tablet      No facility-administered medications prior to visit.     Past Medical History  Diagnosis Date  . Meniere disease   . Blood in stool     years ago, hemorrhoid related  . Chicken pox   . Hyperlipidemia   . 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 Cesarean section    . Eye surgery    . Tubal ligation       Family History  Problem Relation Age of Onset  . Hyperlipidemia Mother   . Hypertension Mother   . Prostate cancer Father   . Hyperlipidemia Father   . Hypertension Father   . Hyperlipidemia Brother   . Hypertension Brother   . Hypertension Maternal Grandmother   . Breast cancer Maternal Grandmother   . Heart attack Maternal Grandfather 32  . Hypertension Paternal Grandmother   . Heart attack Paternal Grandmother   . Heart attack Paternal Grandfather [id number]      History   Social History  . Marital Status: Single    Spouse Name: N/A  . Number of Children: 1  . Years of Education: 14   Occupational History  . Customer Service    Social History Main Topics  . Smoking status: Never Smoker   . Smokeless tobacco: Never Used  . Alcohol Use: No  . Drug Use: No  . Sexual Activity:    Partners: Female, Female    Birth Control/ Protection: Surgical   Other Topics Concern  . Not on file   Social History Narrative   Fun: Walk, shop and read   Denies religious beliefs effecting health care.   Currently feels safe at home.     Review of Systems  Constitutional: Denies fever, chills, fatigue, or significant weight gain/loss. HENT: Head: Denies headache or neck pain Ears: Denies changes in hearing, ringing in ears, earache, drainage Nose: Denies discharge, stuffiness, itching, nosebleed, sinus pain Throat: Denies sore throat, hoarseness, dry mouth, sores, thrush Eyes: Denies loss/changes in vision, pain, redness, blurry/double vision, flashing lights Cardiovascular: Denies chest pain/discomfort, tightness, palpitations, shortness of breath with activity, difficulty lying down, swelling, sudden awakening with shortness of breath Respiratory: Denies shortness of breath, cough,  sputum production, wheezing Gastrointestinal: Denies dysphasia, heartburn, change in appetite, nausea, change in bowel habits, rectal bleeding, constipation, diarrhea, yellow skin or eyes Genitourinary: Denies blood in urine, incontinence, change in urinary strength. Positive for burning, urgency and frequency which has been going on for a couple of days. There are no modifying factors that make it better or worse.  Musculoskeletal: Denies muscle/joint pain, stiffness, back pain, redness or swelling of joints, trauma Skin: Denies rashes, lumps, itching, dryness, color changes, or hair/nail changes Neurological: Denies dizziness, fainting, seizures, weakness, numbness,  tingling, tremor Psychiatric - Denies nervousness, stress, depression or memory loss Endocrine: Denies heat or cold intolerance, sweating, frequent urination, excessive thirst, changes in appetite Hematologic: Denies ease of bruising or bleeding     Objective:     BP 126/92 mmHg  Pulse 81  Temp(Src) 98 F (36.7 C) (Oral)  Resp 18  Ht  (1.676 m)  Wt 169 lb 12.8 oz (77.021 kg)  BMI 27.42 kg/m2  SpO2 98% Nursing note and vital signs reviewed.  Physical Exam  Constitutional: She is oriented to person, place, and time. She appears well-developed and well-nourished.  HENT:  Head: Normocephalic.  Right Ear: Hearing, tympanic membrane, external ear and ear canal normal.  Left Ear: Hearing, tympanic membrane, external ear and ear canal normal.  Nose: Nose normal.  Mouth/Throat: Uvula is midline, oropharynx is clear and moist and mucous membranes are normal.  Eyes: Conjunctivae and EOM are normal. Pupils are equal, round, and reactive to light.  Neck: Neck supple. No JVD present. No tracheal deviation present. No thyromegaly present.  Cardiovascular: Normal rate, regular rhythm, normal heart sounds and intact distal pulses.   Pulmonary/Chest: Effort normal and breath sounds normal.  Abdominal: Soft. Bowel sounds are normal. She exhibits no distension and no mass. There is no tenderness. There is no rebound and no guarding.  Musculoskeletal: Normal range of motion. She exhibits no edema or tenderness.  Lymphadenopathy:    She has no cervical adenopathy.  Neurological: She is alert and oriented to person, place, and time. She has normal reflexes. No cranial nerve deficit. She exhibits normal muscle tone. Coordination normal.  Skin: Skin is warm and dry.  Psychiatric: She has a normal mood and affect. Her behavior is normal. Judgment and thought content normal.       Assessment & Plan:   Problem List Items Addressed This Visit      Other   Routine general medical examination at  a health care facility - Primary    1) Anticipatory Guidance: Discussed importance of wearing a seatbelt while driving and not texting while driving; changing batteries in smoke detector at least once annually; wearing suntan lotion when outside; eating a balanced and moderate diet; getting physical activity at least 30 minutes per day.  2) Immunizations / Screenings / Labs:  Tetanus updated today. All other immunizations are up-to-date per recommendations. All screenings are up-to-date per recommendations. Obtain CBC, BMET, Lipid profile and TSH.   Overall well exam. Patient has several risk factors for cardiovascular disease including hyperlipidemia and significant family history. Hyperlipidemia currently controlled with diet and current status will be obtained with new lipid profile. Discussed with patient's pain more aggressive with her treatment secondary to her family history of early cardiac death. Continue other healthy lifestyle behaviors and choices. Follow up prevention exam in 1 year. Follow-up office visit pending lab work.      Relevant Orders   CBC (Completed)   Lipid panel (Completed)   TSH (  Completed)   Basic metabolic panel (Completed)   HIV antibody   Dysuria    In office urinalysis shows positive for leukocytes and negative for hematuria and nitrites which is consistent with a urinary tract infection. Start ciprofloxacin. Start Diflucan as needed for after antibiotic candidiasis. Follow-up if symptoms worsen or do not improve with treatment regimen.      Relevant Medications   ciprofloxacin (CIPRO) 250 MG tablet   fluconazole (DIFLUCAN) 150 MG tablet   Other Relevant Orders   POCT urinalysis dipstick (Completed)   Urine culture

## 2014-10-29 NOTE — Addendum Note (Signed)
Addended by: Mercer PodWRENN, Marri Mcneff E on: 10/29/2014 01:23 PM   Modules accepted: Orders

## 2014-10-29 NOTE — Assessment & Plan Note (Signed)
In office urinalysis shows positive for leukocytes and negative for hematuria and nitrites which is consistent with a urinary tract infection. Start ciprofloxacin. Start Diflucan as needed for after antibiotic candidiasis. Follow-up if symptoms worsen or do not improve with treatment regimen.

## 2014-10-29 NOTE — Assessment & Plan Note (Addendum)
1) Anticipatory Guidance: Discussed importance of wearing a seatbelt while driving and not texting while driving; changing batteries in smoke detector at least once annually; wearing suntan lotion when outside; eating a balanced and moderate diet; getting physical activity at least 30 minutes per day.  2) Immunizations / Screenings / Labs:  Tetanus updated today. All other immunizations are up-to-date per recommendations. All screenings are up-to-date per recommendations. Obtain CBC, BMET, Lipid profile and TSH.   Overall well exam. Patient has several risk factors for cardiovascular disease including hyperlipidemia and significant family history. Hyperlipidemia currently controlled with diet and current status will be obtained with new lipid profile. Discussed with patient's pain more aggressive with her treatment secondary to her family history of early cardiac death. Continue other healthy lifestyle behaviors and choices. Follow up prevention exam in 1 year. Follow-up office visit pending lab work.

## 2014-10-30 LAB — HIV ANTIBODY (ROUTINE TESTING W REFLEX): HIV: NONREACTIVE

## 2014-10-31 ENCOUNTER — Telehealth: Payer: Self-pay | Admitting: Family

## 2014-10-31 LAB — URINE CULTURE: Colony Count: 25000

## 2014-10-31 MED ORDER — PRAVASTATIN SODIUM 20 MG PO TABS: 20.0000 mg | ORAL_TABLET | Freq: Every day | ORAL | Status: AC

## 2014-10-31 NOTE — Telephone Encounter (Signed)
Please inform patient that her blood work shows that her HIV was negative. Her kidney function, electrolytes, white/red blood cells, thyroid, and urine culture were all within the normal limits. Her cholesterol was slightly elevated with a total cholesterol of 240 with a goal less than 200 and a bad cholesterol or LDL of 170 with a goal less than 100. Given her family history heart disease, I recommend starting a low-dose statin medication to reduce her cholesterol. I have sent the medication to her pharmacy. If she develops muscle cramps or joint pain please let us know. We'll follow-up in about 6 months to recheck the status of her cholesterol.

## 2014-11-01 NOTE — Telephone Encounter (Signed)
Pt aware of results and aware of medication sent to pharmacy.

## 2014-12-19 ENCOUNTER — Encounter: Payer: Self-pay | Admitting: Family

## 2014-12-19 ENCOUNTER — Ambulatory Visit (INDEPENDENT_AMBULATORY_CARE_PROVIDER_SITE_OTHER): Payer: Managed Care, Other (non HMO) | Admitting: Family

## 2014-12-19 VITALS — BP 120/90 | HR 102 | Temp 98.6°F | Resp 18 | Ht 66.0 in | Wt 168.8 lb

## 2014-12-19 DIAGNOSIS — J069 Acute upper respiratory infection, unspecified: Secondary | ICD-10-CM

## 2014-12-19 NOTE — Assessment & Plan Note (Signed)
Symptoms consistent with acute upper respiratory infection most likely viral. Continue conservative treatment with over-the-counter medications as needed for symptom relief and supportive care. Follow up if symptoms worsen or fail to improve.

## 2014-12-19 NOTE — Progress Notes (Signed)
   Subjective:    Patient ID: Kristi Mayo, female    DOB: November 23, 1970, 44 y.o.   MRN: [id number]  Chief Complaint  Patient presents with  . Sore Throat    Sore throat, congestion, drainage, little bit of a cough, sometimes productive    HPI:  Kristi Mayo is a 44 y.o. female with a PMH of Mnire's disease, herpes, and hyperlipidemia who presents today for an acute office visit.   This is a new problem. Associated symptoms of sore throat, congestion, drainage, and occasional productive cough have been going on for about 1 day . Modifying factors include an over the counter sinus medication which did not seem to help very much. Denies any antibiotic use.   Allergies  Allergen Reactions  . Penicillins Nausea And Vomiting    Current Outpatient Prescriptions on File Prior to Visit  Medication Sig Dispense Refill  . chlorthalidone (HYGROTON) 25 MG tablet     . fluconazole (DIFLUCAN) 150 MG tablet Take 1 tablet (150 mg total) by mouth once. 1 tablet 2  . potassium chloride (MICRO-K) 10 MEQ CR capsule     . pravastatin (PRAVACHOL) 20 MG tablet Take 1 tablet (20 mg total) by mouth daily. 30 tablet 2  . valACYclovir (VALTREX) 1000 MG tablet      No current facility-administered medications on file prior to visit.    Review of Systems  Constitutional: Negative for fever and chills.  HENT: Positive for congestion, sinus pressure and sore throat.   Respiratory: Positive for cough.   Neurological: Positive for headaches.      Objective:    BP 120/90 mmHg  Pulse 102  Temp(Src) 98.6 F (37 C) (Oral)  Resp 18  Ht  (1.676 m)  Wt 168 lb 12.8 oz (76.567 kg)  BMI 27.26 kg/m2  SpO2 98% Nursing note and vital signs reviewed.  Physical Exam  Constitutional: She is oriented to person, place, and time. She appears well-developed and well-nourished. No distress.  HENT:  Right Ear: Hearing, tympanic membrane, external ear and ear canal normal.  Left Ear: Hearing, tympanic  membrane, external ear and ear canal normal.  Nose: Right sinus exhibits maxillary sinus tenderness. Right sinus exhibits no frontal sinus tenderness. Left sinus exhibits maxillary sinus tenderness. Left sinus exhibits no frontal sinus tenderness.  Mouth/Throat: Uvula is midline, oropharynx is clear and moist and mucous membranes are normal.  Cardiovascular: Normal rate, regular rhythm, normal heart sounds and intact distal pulses.   Pulmonary/Chest: Effort normal and breath sounds normal.  Neurological: She is alert and oriented to person, place, and time.  Skin: Skin is warm and dry.  Psychiatric: She has a normal mood and affect. Her behavior is normal. Judgment and thought content normal.       Assessment & Plan:   Problem List Items Addressed This Visit      Respiratory   Acute upper respiratory infection - Primary    Symptoms consistent with acute upper respiratory infection most likely viral. Continue conservative treatment with over-the-counter medications as needed for symptom relief and supportive care. Follow up if symptoms worsen or fail to improve.

## 2014-12-19 NOTE — Patient Instructions (Signed)
Thank you for choosing [facility].  Summary/Instructions:  Your prescription(s) have been submitted to your pharmacy or been printed and provided for you. Please take as directed and contact our office if you believe you are having problem(s) with the medication(s) or have any questions.  If your symptoms worsen or fail to improve, please contact our office for further instruction, or in case of emergency go directly to the emergency room at the closest medical facility.   General Recommendations:    Please drink plenty of fluids.  Get plenty of rest   Sleep in humidified air  Use saline nasal sprays  Netti pot   OTC Medications:  Decongestants - helps relieve congestion   Flonase (generic fluticasone) or Nasacort (generic triamcinolone) - please make sure to use the "cross-over" technique at a 45 degree angle towards the opposite eye as opposed to straight up the nasal passageway.   Sudafed (generic pseudoephedrine - Note this is the one that is available behind the pharmacy counter); Products with phenylephrine (-PE) may also be used but is often not as effective as pseudoephedrine.   If you have HIGH BLOOD PRESSURE - Coricidin HBP; AVOID any product that is -D as this contains pseudoephedrine which may increase your blood pressure.  Afrin (oxymetazoline) every 6-8 hours for up to 3 days.   Allergies - helps relieve runny nose, itchy eyes and sneezing   Claritin (generic loratidine), Allegra (fexofenidine), or Zyrtec (generic cyrterizine) for runny nose. These medications should not cause drowsiness.  Note - Benadryl (generic diphenhydramine) may be used however may cause drowsiness  Cough -   Delsym or Robitussin (generic dextromethorphan)  Expectorants - helps loosen mucus to ease removal   Mucinex (generic guaifenesin) as directed on the package.  Headaches / General Aches   Tylenol (generic acetaminophen) - DO NOT EXCEED 3 grams (3,000 mg) in a 24  hour time period  Advil/Motrin (generic ibuprofen)   Sore Throat -   Salt water gargle   Chloraseptic (generic benzocaine) spray or lozenges / Sucrets (generic dyclonine)        Upper Respiratory Infection, Adult An upper respiratory infection (URI) is also sometimes known as the common cold. The upper respiratory tract includes the nose, sinuses, throat, trachea, and bronchi. Bronchi are the airways leading to the lungs. Most people improve within 1 week, but symptoms can last up to 2 weeks. A residual cough may last even longer.  CAUSES Many different viruses can infect the tissues lining the upper respiratory tract. The tissues become irritated and inflamed and often become very moist. Mucus production is also common. A cold is contagious. You can easily spread the virus to others by oral contact. This includes kissing, sharing a glass, coughing, or sneezing. Touching your mouth or nose and then touching a surface, which is then touched by another person, can also spread the virus. SYMPTOMS  Symptoms typically develop 1 to 3 days after you come in contact with a cold virus. Symptoms vary from person to person. They may include: 4. Runny nose. 5. Sneezing. 6. Nasal congestion. 7. Sinus irritation. 8. Sore throat. 9. Loss of voice (laryngitis). 10. Cough. 11. Fatigue. 12. Muscle aches. 13. Loss of appetite. 14. Headache. 15. Low-grade fever. DIAGNOSIS  You might diagnose your own cold based on familiar symptoms, since most people get a cold 2 to 3 times a year. Your caregiver can confirm this based on your exam. Most importantly, your caregiver can check that your symptoms are not due to   another disease such as strep throat, sinusitis, pneumonia, asthma, or epiglottitis. Blood tests, throat tests, and X-rays are not necessary to diagnose a common cold, but they may sometimes be helpful in excluding other more serious diseases. Your caregiver will decide if any further tests are  required. RISKS AND COMPLICATIONS  You may be at risk for a more severe case of the common cold if you smoke cigarettes, have chronic heart disease (such as heart failure) or lung disease (such as asthma), or if you have a weakened immune system. The very young and very old are also at risk for more serious infections. Bacterial sinusitis, middle ear infections, and bacterial pneumonia can complicate the common cold. The common cold can worsen asthma and chronic obstructive pulmonary disease (COPD). Sometimes, these complications can require emergency medical care and may be life-threatening. PREVENTION  The best way to protect against getting a cold is to practice good hygiene. Avoid oral or hand contact with people with cold symptoms. Wash your hands often if contact occurs. There is no clear evidence that vitamin C, vitamin E, echinacea, or exercise reduces the chance of developing a cold. However, it is always recommended to get plenty of rest and practice good nutrition. TREATMENT  Treatment is directed at relieving symptoms. There is no cure. Antibiotics are not effective, because the infection is caused by a virus, not by bacteria. Treatment may include: 2. Increased fluid intake. Sports drinks offer valuable electrolytes, sugars, and fluids. 3. Breathing heated mist or steam (vaporizer or shower). 4. Eating chicken soup or other clear broths, and maintaining good nutrition. 5. Getting plenty of rest. 6. Using gargles or lozenges for comfort. 7. Controlling fevers with ibuprofen or acetaminophen as directed by your caregiver. 8. Increasing usage of your inhaler if you have asthma. Zinc gel and zinc lozenges, taken in the first 24 hours of the common cold, can shorten the duration and lessen the severity of symptoms. Pain medicines may help with fever, muscle aches, and throat pain. A variety of non-prescription medicines are available to treat congestion and runny nose. Your caregiver can make  recommendations and may suggest nasal or lung inhalers for other symptoms.  HOME CARE INSTRUCTIONS  2. Only take over-the-counter or prescription medicines for pain, discomfort, or fever as directed by your caregiver. 3. Use a warm mist humidifier or inhale steam from a shower to increase air moisture. This may keep secretions moist and make it easier to breathe. 4. Drink enough water and fluids to keep your urine clear or pale yellow. 5. Rest as needed. 6. Return to work when your temperature has returned to normal or as your caregiver advises. You may need to stay home longer to avoid infecting others. You can also use a face mask and careful hand washing to prevent spread of the virus. SEEK MEDICAL CARE IF:  3. After the first few days, you feel you are getting worse rather than better. 4. You need your caregiver's advice about medicines to control symptoms. 5. You develop chills, worsening shortness of breath, or brown or red sputum. These may be signs of pneumonia. 6. You develop yellow or brown nasal discharge or pain in the face, especially when you bend forward. These may be signs of sinusitis. 7. You develop a fever, swollen neck glands, pain with swallowing, or white areas in the back of your throat. These may be signs of strep throat. SEEK IMMEDIATE MEDICAL CARE IF:  3. You have a fever. 4. You develop severe or   persistent headache, ear pain, sinus pain, or chest pain. 5. You develop wheezing, a prolonged cough, cough up blood, or have a change in your usual mucus (if you have chronic lung disease). 6. You develop sore muscles or a stiff neck. Document Released: 09/30/2000 Document Revised: 06/29/2011 Document Reviewed: 07/12/2013 [employer] Patient Information 2015 [employer]. This information is not intended to replace advice given to you by your health care provider. Make sure you discuss any questions you have with your health care provider.    

## 2015-02-20 ENCOUNTER — Ambulatory Visit (INDEPENDENT_AMBULATORY_CARE_PROVIDER_SITE_OTHER): Payer: Managed Care, Other (non HMO) | Admitting: Family

## 2015-02-20 ENCOUNTER — Other Ambulatory Visit: Payer: Managed Care, Other (non HMO)

## 2015-02-20 ENCOUNTER — Encounter: Payer: Self-pay | Admitting: Family

## 2015-02-20 VITALS — BP 120/82 | Temp 98.4°F | Ht 66.0 in | Wt 164.8 lb

## 2015-02-20 DIAGNOSIS — R1030 Lower abdominal pain, unspecified: Secondary | ICD-10-CM | POA: Diagnosis not present

## 2015-02-20 DIAGNOSIS — R300 Dysuria: Secondary | ICD-10-CM

## 2015-02-20 LAB — POCT URINALYSIS DIPSTICK
Bilirubin, UA: NEGATIVE
Blood, UA: NEGATIVE
Glucose, UA: NEGATIVE
Ketones, UA: NEGATIVE
Leukocytes, UA: NEGATIVE
NITRITE UA: NEGATIVE
PH UA: 6
Spec Grav, UA: >=1.03
UROBILINOGEN UA: 1

## 2015-02-20 MED ORDER — CIPROFLOXACIN HCL 250 MG PO TABS: 250.0000 mg | ORAL_TABLET | Freq: Two times a day (BID) | ORAL | Status: AC

## 2015-02-20 MED ORDER — FLUCONAZOLE 150 MG PO TABS: 150.0000 mg | ORAL_TABLET | Freq: Once | ORAL | Status: DC

## 2015-02-20 NOTE — Assessment & Plan Note (Addendum)
UA negative for nitrites, leukocytes and hematuria. Does have symptoms of urinary tract infection. Obtain culture. Treat empirically with Cipro. Start diflucan as needed for post-antibiotic candidiasis. Encourage fluid intake. Follow up if symptoms worsen or fail to improve.

## 2015-02-20 NOTE — Progress Notes (Signed)
Subjective:    Patient ID: Kristi Mayo, female    DOB: 1970-05-24, 44 y.o.   MRN: [id number]  Chief Complaint  Patient presents with  . Abdominal Pain    started having pain in her lower abdomen and back, almost like cramps but not cramps, did have some burning with urination here and there    HPI:  Kristi Mayo is a 44 y.o. 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 and presents today for an acute office visit.   This is a new problem. Associated symptom of lower abdominal and back pain described as like having cramps going on for about 3-4 days. She has also noted some urgency and burning with urination of occasion. Denies fevers, chills, frequency. Denies any modifying factors in attempted treatment. Does usual have menstrual cramps and is due for her menstrual cycle, however it started several days early. Denies any vaginal discharge or odor.      Allergies  Allergen Reactions  . Penicillins Nausea And Vomiting    Current Outpatient Prescriptions on File Prior to Visit  Medication Sig Dispense Refill  . chlorthalidone (HYGROTON) 25 MG tablet     . potassium chloride (MICRO-K) 10 MEQ CR capsule     . pravastatin (PRAVACHOL) 20 MG tablet Take 1 tablet (20 mg total) by mouth daily. 30 tablet 2  . valACYclovir (VALTREX) 1000 MG tablet      No current facility-administered medications on file prior to visit.     Past Surgical History  Procedure Laterality Date  . Cesarean section    . Eye surgery    . Tubal ligation       Review of Systems  Constitutional: Negative for fever and chills.  Gastrointestinal: Positive for abdominal pain.  Genitourinary: Positive for dysuria and urgency. Negative for frequency, hematuria and flank pain.      Objective:    BP 120/82 mmHg  Temp(Src) 98.4 F (36.9 C) (Oral)  Ht  (1.676 m)  Wt 164 lb 12.8 oz (74.753 kg)  BMI 26.61  kg/m2 Nursing note and vital signs reviewed.  Physical Exam  Constitutional: She is oriented to person, place, and time. She appears well-developed and well-nourished. No distress.  Cardiovascular: Normal rate, regular rhythm, normal heart sounds and intact distal pulses.   Pulmonary/Chest: Effort normal and breath sounds normal.  Abdominal: Soft. Normal appearance and bowel sounds are normal. There is no hepatosplenomegaly. There is tenderness in the right lower quadrant and left lower quadrant. There is no rigidity, no rebound, no guarding, no CVA tenderness, no tenderness at McBurney's point and negative Murphy's sign.  Neurological: She is alert and oriented to person, place, and time.  Skin: Skin is warm and dry.  Psychiatric: She has a normal mood and affect. Her behavior is normal. Judgment and thought content normal.       Assessment & Plan:   Problem List Items Addressed This Visit      Other   Dysuria - Primary    UA negative for nitrites, leukocytes and hematuria. Does have symptoms of urinary tract infection. Obtain culture. Treat empirically with Cipro. Start diflucan as needed for post-antibiotic candidiasis. Encourage fluid intake. Follow up if symptoms worsen or fail to improve.       Relevant Medications   fluconazole (DIFLUCAN) 150 MG tablet   Other Relevant Orders   POCT urinalysis dipstick (Completed)   Urine culture  Lower abdominal pain    Lower abdominal pain bilaterally consistent with menstrual cramps. Little concern for underlying pathology. Treat conservatively with Duexis. Recommend warm heat packs as needed. Follow up if symptoms worsen or fail to improve.

## 2015-02-20 NOTE — Patient Instructions (Addendum)
Thank you for choosing [facility].  Summary/Instructions:  Your prescription(s) have been submitted to your pharmacy or been printed and provided for you. Please take as directed and contact our office if you believe you are having problem(s) with the medication(s) or have any questions.  If your symptoms worsen or fail to improve, please contact our office for further instruction, or in case of emergency go directly to the emergency room at the closest medical facility.   Urinary Tract Infection Urinary tract infections (UTIs) can develop anywhere along your urinary tract. Your urinary tract is your body's drainage system for removing wastes and extra water. Your urinary tract includes two kidneys, two ureters, a bladder, and a urethra. Your kidneys are a pair of bean-shaped organs. Each kidney is about the size of your fist. They are located below your ribs, one on each side of your spine. CAUSES Infections are caused by microbes, which are microscopic organisms, including fungi, viruses, and bacteria. These organisms are so small that they can only be seen through a microscope. Bacteria are the microbes that most commonly cause UTIs. SYMPTOMS  Symptoms of UTIs may vary by age and gender of the patient and by the location of the infection. Symptoms in young women typically include a frequent and intense urge to urinate and a painful, burning feeling in the bladder or urethra during urination. Older women and men are more likely to be tired, shaky, and weak and have muscle aches and abdominal pain. A fever may mean the infection is in your kidneys. Other symptoms of a kidney infection include pain in your back or sides below the ribs, nausea, and vomiting. DIAGNOSIS To diagnose a UTI, your caregiver will ask you about your symptoms. Your caregiver will also ask you to provide a urine sample. The urine sample will be tested for bacteria and white blood cells. White blood cells are made by your  body to help fight infection. TREATMENT  Typically, UTIs can be treated with medication. Because most UTIs are caused by a bacterial infection, they usually can be treated with the use of antibiotics. The choice of antibiotic and length of treatment depend on your symptoms and the type of bacteria causing your infection. HOME CARE INSTRUCTIONS  If you were prescribed antibiotics, take them exactly as your caregiver instructs you. Finish the medication even if you feel better after you have only taken some of the medication.  Drink enough water and fluids to keep your urine clear or pale yellow.  Avoid caffeine, tea, and carbonated beverages. They tend to irritate your bladder.  Empty your bladder often. Avoid holding urine for long periods of time.  Empty your bladder before and after sexual intercourse.  After a bowel movement, women should cleanse from front to back. Use each tissue only once. SEEK MEDICAL CARE IF:   You have back pain.  You develop a fever.  Your symptoms do not begin to resolve within 3 days. SEEK IMMEDIATE MEDICAL CARE IF:   You have severe back pain or lower abdominal pain.  You develop chills.  You have nausea or vomiting.  You have continued burning or discomfort with urination. MAKE SURE YOU:   Understand these instructions.  Will watch your condition.  Will get help right away if you are not doing well or get worse.   This information is not intended to replace advice given to you by your health care provider. Make sure you discuss any questions you have with your health care   provider.   Document Released: 01/14/2005 Document Revised: 12/26/2014 Document Reviewed: 05/15/2011 Elsevier Interactive Patient Education 2016 [employer]  Premenstrual Syndrome Premenstrual syndrome (PMS) is a condition that consists of physical, emotional, and behavioral symptoms that affect women of childbearing age. PMS occurs 5-14 days before the start of a  menstrual period and often recurs in a predictable pattern. The symptoms go away a few days after the menstrual period starts. PMS can interfere in many ways with normal daily activities and can range from mild to severe. When PMS is considered severe, it may be diagnosed as premenstrual dysphoric disorder (PMDD). A small percentage of women are affected by PMS symptoms and an even smaller percentage of those women are affected by PMDD.  CAUSES  The exact cause of PMS is unknown, but it seems to be related to cyclic hormone changes that happen before menstruation. These hormones are thought to affect chemicals in the brain (serotonin) that can influence a person's mood.  SYMPTOMS  Symptoms of PMS recur consistently from month to month and go away completely after the menstrual period starts. The most common emotional or behavioral symptom is mood swings. These mood swings can be disabling and interfere with normal activities of daily living. Other common symptoms include depression and angry outbursts. Other symptoms may include:   Irritability.  Anxiety.  Crying spells.   Food cravings or appetite changes.   Changes in sexual desire.   Confusion.   Aggression.   Social withdrawal.   Poor concentration. The most common physical symptoms include a sense of bloating, breast pain, headaches, and extreme fatigue. Other physical symptoms include:   Backaches.   Swelling of the hands and feet.   Weight gain.   Hot flashes.  DIAGNOSIS  To make a diagnosis, your caregiver will ask questions to confirm that you are having a pattern of symptoms. Symptoms must:   Be present 5 days before the start of your period and be present at least 3 months in a row.   End within 4 days after your period starts.   Interfere with some of your normal activities.  Other conditions, such as thyroid disease, depression, and migraine headaches must be ruled out before a diagnosis of PMS is  confirmed.  TREATMENT  Your caregiver may suggest ways to maintain a healthy lifestyle, such as exercise. Over-the-counter pain relievers may ease cramps, aches, pains, headaches, and breast tenderness. However, selective serotonin reuptake inhibitors (SSRIs) are medicines that are most beneficial in improving PMS if taken in the second half of the monthly cycle. They may be taken on a daily basis. The most effective oral contraceptive pill used for symptoms of PMS is one that contains the ingredient drospirenone. Taking 4 days off of the pill instead of the usual 7 days also has shown to increase effectiveness.  There are a number of drugs, dietary supplements, vitamins, and water pills (diuretics) which have been suggested to be helpful but have not shown to be of any benefit to improving PMS symptoms.  HOME CARE INSTRUCTIONS   For 2-3 months, write down your symptoms, their severity, and how long they last. This may help your caregiver prescribe the best treatment for your symptoms.  Exercise regularly as suggested by your caregiver.  Eat a regular, well-balanced diet.  Avoid caffeine, alcohol, and tobacco consumption.  Limit salt and salty foods to lessen bloating and fluid retention.  Get enough sleep. Practice relaxation techniques.  Drink enough fluids to keep your urine clear or  pale yellow.  Take medicines as directed by your caregiver.  Limit stress.  Take a multivitamin as directed by your caregiver.   This information is not intended to replace advice given to you by your health care provider. Make sure you discuss any questions you have with your health care provider.   Document Released: 04/03/2000 Document Revised: 12/30/2011 Document Reviewed: 08/24/2011 Elsevier Interactive Patient Education [employer]

## 2015-02-20 NOTE — Progress Notes (Signed)
Pre visit review using our clinic review tool, if applicable. No additional management support is needed unless otherwise documented below in the visit note. 

## 2015-02-20 NOTE — Assessment & Plan Note (Signed)
Lower abdominal pain bilaterally consistent with menstrual cramps. Little concern for underlying pathology. Treat conservatively with Duexis. Recommend warm heat packs as needed. Follow up if symptoms worsen or fail to improve.

## 2015-02-21 ENCOUNTER — Telehealth: Payer: Self-pay | Admitting: Family

## 2015-02-21 LAB — URINE CULTURE

## 2015-02-21 NOTE — Telephone Encounter (Signed)
Please inform patient that her urine culture did not show significant bacteria to be classified as a UTI. If she continues to have symptoms, please let us know.

## 2015-02-22 NOTE — Telephone Encounter (Signed)
Pt aware of results 

## 2015-03-22 ENCOUNTER — Ambulatory Visit (INDEPENDENT_AMBULATORY_CARE_PROVIDER_SITE_OTHER): Payer: Managed Care, Other (non HMO) | Admitting: Internal Medicine

## 2015-03-22 ENCOUNTER — Other Ambulatory Visit (INDEPENDENT_AMBULATORY_CARE_PROVIDER_SITE_OTHER): Payer: Managed Care, Other (non HMO)

## 2015-03-22 ENCOUNTER — Encounter: Payer: Self-pay | Admitting: Internal Medicine

## 2015-03-22 VITALS — BP 122/82 | HR 88 | Temp 98.3°F | Resp 12 | Ht 66.0 in | Wt 169.8 lb

## 2015-03-22 DIAGNOSIS — H8103 Meniere's disease, bilateral: Secondary | ICD-10-CM

## 2015-03-22 DIAGNOSIS — H9313 Tinnitus, bilateral: Secondary | ICD-10-CM | POA: Diagnosis not present

## 2015-03-22 DIAGNOSIS — R51 Headache: Secondary | ICD-10-CM

## 2015-03-22 DIAGNOSIS — R519 Headache, unspecified: Secondary | ICD-10-CM

## 2015-03-22 LAB — BASIC METABOLIC PANEL
BUN: 8 mg/dL (ref 6–23)
CALCIUM: 9.1 mg/dL (ref 8.4–10.5)
CO2: 30 meq/L (ref 19–32)
Chloride: 105 mEq/L (ref 96–112)
Creatinine, Ser: 0.65 mg/dL (ref 0.40–1.20)
GFR: 127.37 mL/min (ref >60.00)
GLUCOSE: 92 mg/dL (ref 70–99)
POTASSIUM: 3.6 meq/L (ref 3.5–5.1)
SODIUM: 141 meq/L (ref 135–145)

## 2015-03-22 MED ORDER — CHLORTHALIDONE 25 MG PO TABS: ORAL_TABLET | ORAL | Status: AC

## 2015-03-22 MED ORDER — CHLORTHALIDONE 25 MG PO TABS: 25.0000 mg | ORAL_TABLET | Freq: Every day | ORAL | Status: DC

## 2015-03-22 MED ORDER — POTASSIUM CHLORIDE ER 10 MEQ PO CPCR: ORAL_CAPSULE | ORAL | Status: AC

## 2015-03-22 MED ORDER — AMOXICILLIN 500 MG PO CAPS: 500.0000 mg | ORAL_CAPSULE | Freq: Three times a day (TID) | ORAL | Status: DC

## 2015-03-22 NOTE — Progress Notes (Signed)
Pre visit review using our clinic review tool, if applicable. No additional management support is needed unless otherwise documented below in the visit note. 

## 2015-03-22 NOTE — Patient Instructions (Signed)

## 2015-03-22 NOTE — Progress Notes (Signed)
   Subjective:    Patient ID: Kristi Mayo, female    DOB: [birth year], 44 y.o.   MRN: [id number]  HPI For the last 1.5 weeks she's had constant "booming" in both ears, left greater than right. She's also had occasional sharp pain. The symptoms were associated with slight dizziness.  She has a history of Mnire's and sees Dr. Haroldine Lawsrossley. She has been on chlorthalidone as well as a potassium supplement.  Other than some intermittent facial pain she has no upper or lower respiratory tract or extrinsic symptoms.   Review of Systems Frontal headache, nasal purulence, dental pain, persistent sore throat , or otic discharge denied. No fever , chills or sweats.  Extrinsic symptoms of itchy, watery eyes, sneezing, or angioedema are denied. There is no significant cough, sputum production, wheezing,or  paroxysmal nocturnal dyspnea.     Objective:   Physical Exam Pertinent or positive findings include: There is severe erythema of the nasal mucosa. Light reflex is decreased over both tympanic membranes. Tuning fork lateralizes to the left. Air conduction is greater than bone conduction bilaterally. Whisper heard @ 6 feet.  General appearance:Adequately nourished; no acute distress or increased work of breathing is present.    Lymphatic: No  lymphadenopathy about the head, neck, or axilla .  Eyes: No conjunctival inflammation or lid edema is present. There is no scleral icterus.  Ears:  External ear exam shows no significant lesions or deformities.  Otoscopic examination reveals clear canals, tympanic membranes are intact bilaterally without bulging, retraction, inflammation or discharge.  Nose:  External nasal examination shows no deformity or inflammation.  No septal dislocation or deviation.No obstruction to airflow.   Oral exam: Dental hygiene is good; lips and gums are healthy appearing.There is no oropharyngeal erythema or exudate .  Neck:  No deformities, thyromegaly, masses, or  tenderness noted.   Supple with full range of motion without pain.   Heart:  Normal rate and regular rhythm. S1 and S2 normal without gallop, murmur, click, rub or other extra sounds.   Lungs:Chest clear to auscultation; no wheezes, rhonchi,rales ,or rubs present.  Extremities:  No cyanosis, edema, or clubbing  noted    Skin: Warm & dry w/o tenting or jaundice. No significant lesions or rash.        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 to the left

## 2015-06-27 ENCOUNTER — Ambulatory Visit (INDEPENDENT_AMBULATORY_CARE_PROVIDER_SITE_OTHER): Payer: Managed Care, Other (non HMO) | Admitting: Adult Health

## 2015-06-27 VITALS — BP 120/80 | Temp 98.5°F

## 2015-06-27 DIAGNOSIS — J302 Other seasonal allergic rhinitis: Secondary | ICD-10-CM

## 2015-06-27 NOTE — Patient Instructions (Addendum)
It was great meeting you today   Start using Claritin or Allegra. Also use Flonase in conjunction with those allergy medications.   Place ice on the site of facial swelling for 20 minutes at a time.   Follow up with PCP if no improvement.   .Allergies An allergy is an abnormal reaction to a substance by the body's defense system (immune system). Allergies can develop at any age. WHAT CAUSES ALLERGIES? An allergic reaction happens when the immune system mistakenly reacts to a normally harmless substance, called an allergen, as if it were harmful. The immune system releases antibodies to fight the substance. Antibodies eventually release a chemical called histamine into the bloodstream. The release of histamine is meant to protect the body from infection, but it also causes discomfort. An allergic reaction can be triggered by:  Eating an allergen.  Inhaling an allergen.  Touching an allergen. WHAT TYPES OF ALLERGIES ARE THERE? There are many types of allergies. Common types include:  Seasonal allergies. People with this type of allergy are usually allergic to substances that are only present during certain seasons, such as molds and pollens.  Food allergies.  Drug allergies.  Insect allergies.  Animal dander allergies. WHAT ARE SYMPTOMS OF ALLERGIES? Possible allergy symptoms include:  Swelling of the lips, face, tongue, mouth, or throat.  Sneezing, coughing, or wheezing.  Nasal congestion.  Tingling in the mouth.  Rash.  Itching.  Itchy, red, swollen areas of skin (hives).  Watery eyes.  Vomiting.  Diarrhea.  Dizziness.  Lightheadedness.  Fainting.  Trouble breathing or swallowing.  Chest tightness.  Rapid heartbeat. HOW ARE ALLERGIES DIAGNOSED? Allergies are diagnosed with a medical and family history and one or more of the following:  Skin tests.  Blood tests.  A food diary. A food diary is a record of all the foods and drinks you have in a day  and of all the symptoms you experience.  The results of an elimination diet. An elimination diet involves eliminating foods from your diet and then adding them back in one by one to find out if a certain food causes an allergic reaction. HOW ARE ALLERGIES TREATED? There is no cure for allergies, but allergic reactions can be treated with medicine. Severe reactions usually need to be treated at a hospital. HOW CAN REACTIONS BE PREVENTED? The best way to prevent an allergic reaction is by avoiding the substance you are allergic to. Allergy shots and medicines can also help prevent reactions in some cases. People with severe allergic reactions may be able to prevent a life-threatening reaction called anaphylaxis with a medicine given right after exposure to the allergen.   This information is not intended to replace advice given to you by your health care provider. Make sure you discuss any questions you have with your health care provider.   Document Released: 06/30/2002 Document Revised: 04/27/2014 Document Reviewed: 01/16/2014 Elsevier Interactive Patient Education [employer]2016 [employer]

## 2015-06-27 NOTE — Progress Notes (Signed)
Pre visit review using our clinic review tool, if applicable. No additional management support is needed unless otherwise documented below in the visit note.   Pt declined weight//acm 

## 2015-06-27 NOTE — Progress Notes (Signed)
   Subjective:    Patient ID: Kristi Mayo, female    DOB: May 14, 1970, 45 y.o.   MRN: [id number]  HPI  45 year old female who presents to the office today for " my face swelling, jaw pain, and head pain".  She first noticed the the swelling on the right side of her face on Saturday but when she woke up this morning " it was really swollen." Last night she started having a cough and when she woke up this morning she had jaw pain.   Denies any fevers, n/v/d/.    Review of Systems  Constitutional: Positive for diaphoresis. Negative for fever, chills and fatigue.  HENT: Positive for facial swelling, postnasal drip and rhinorrhea. Negative for congestion, ear discharge, ear pain, sinus pressure and sore throat.   Cardiovascular: Negative.   Gastrointestinal: Negative.   Musculoskeletal: Positive for joint swelling. Negative for neck pain and neck stiffness.  Skin: Negative.        Objective:   Physical Exam  Constitutional: She is oriented to person, place, and time. She appears well-developed and well-nourished. No distress.  HENT:  Head: Normocephalic and atraumatic.  Right Ear: External ear normal.  Left Ear: External ear normal.  Nose: Nose normal.  Mouth/Throat: Oropharynx is clear and moist. No oropharyngeal exudate.  Eyes: Conjunctivae are normal. Pupils are equal, round, and reactive to light. Right eye exhibits no discharge. Left eye exhibits no discharge. No scleral icterus.  Neck: Normal range of motion. Neck supple.  Cardiovascular: Normal rate, regular rhythm and normal heart sounds.  Exam reveals no friction rub.   No murmur heard. Pulmonary/Chest: Effort normal and breath sounds normal. No respiratory distress. She has no wheezes. She has no rales. She exhibits no tenderness.  Musculoskeletal: Normal range of motion.  No TMJ  Lymphadenopathy:    She has no cervical adenopathy.  Neurological: She is alert and oriented to person, place, and time.  Skin: Skin is warm  and dry. No rash noted. She is not diaphoretic. No erythema. No pallor.  Trace swelling on right jaw line.    Psychiatric: She has a normal mood and affect. Her behavior is normal. Judgment and thought content normal.  Nursing note and vitals reviewed.      Assessment & Plan:  1. Seasonal allergies - Does not appear to be any bacterial infection. Likely from seasonal allergies.  - Claritin or Allegra with Flonase - Ice to jaw for 20 minutes at a time - Follow up with PCP if no improvement.

## 2018-12-07 ENCOUNTER — Other Ambulatory Visit: Payer: Self-pay

## 2018-12-07 DIAGNOSIS — Z20822 Contact with and (suspected) exposure to covid-19: Secondary | ICD-10-CM

## 2018-12-08 LAB — NOVEL CORONAVIRUS, NAA: SARS-CoV-2, NAA: NOT DETECTED
